# Patient Record
Sex: Female | Born: 2005 | Hispanic: No | Marital: Single | Smoking: Never smoker
Health system: Southern US, Community
[De-identification: ages and names within clinical notes are randomized; demographics above are authoritative.]

## PROBLEM LIST (undated history)

## (undated) DIAGNOSIS — G43109 Migraine with aura, not intractable, without status migrainosus: Secondary | ICD-10-CM

## (undated) DIAGNOSIS — H9319 Tinnitus, unspecified ear: Secondary | ICD-10-CM

## (undated) HISTORY — DX: Tinnitus, unspecified ear: H93.19

## (undated) HISTORY — DX: Migraine with aura, not intractable, without status migrainosus: G43.109

---
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
## 2023-02-10 ENCOUNTER — Ambulatory Visit (INDEPENDENT_AMBULATORY_CARE_PROVIDER_SITE_OTHER): Payer: BC Managed Care – PPO | Admitting: Adult Health

## 2023-02-10 ENCOUNTER — Encounter: Payer: Self-pay | Admitting: Adult Health

## 2023-02-10 VITALS — BP 126/68 | HR 71 | Temp 97.7°F

## 2023-02-10 DIAGNOSIS — S060X0A Concussion without loss of consciousness, initial encounter: Secondary | ICD-10-CM | POA: Diagnosis not present

## 2023-02-10 NOTE — Progress Notes (Signed)
[facility] [address] [place], [place] [address] Phone: [phone] Fax: [phone]   Office Visit Note  Patient Name: Caitlin Schwartz  Date of Birth [birth year]  Med Record Number [id number]  Date of Service: 02/10/2023  Chief Complaint  Patient presents with   Concussion    Vital Signs: BP 126/68   Pulse 71   Temp 97.7 F (36.5 C) (Tympanic)   SpO2 99%   Patient has no known allergies.  Current Medication:  Outpatient Encounter Medications as of 02/10/2023  Medication Sig   etonogestrel (NEXPLANON) 68 MG IMPL implant 68 mg by subdermal route once.   propranolol (INDERAL) 40 MG tablet Take 40 mg by mouth 2 (two) times daily.   No facility-administered encounter medications on file as of 02/10/2023.    Medical History: Past Medical History:  Diagnosis Date   Migraine with aura    Tinnitus     Acute Concussion Evaluation (ACE)  Date/Time of Injury 02/08/23 1:30am Reported by Patient  Injury Description Patient reports she was running, and tripped on a curb.  She fell forward and hit the left side of her head on the sidewalk.  She bled, and now has been having dizziness, and some head pain.   Is there evidence of a forcible blow to the head (direct or indirect)? Yes Evidence of Intracranial injury or skill fracture? No Location of impact Frontal Causes Fall  Amnesia Before (Retrograde) Are there events just BEFORE the injury that you/person has no memory of (even brief)? No Amnesia After (Anterograde) Are there any events just AFTER the injury that you/person has no memory of (even brief)? Yes Loss of Consciousness No Early signs: N/A Seizures: No  Headache: 1  Nausea: 0  Vomiting: 0  Balance Difficulty: 0  Dizziness: 1  Visual Problems:0  Fatigue: 1  Sensitivity to light: 1  Sensitivity to noise: 1  Numbness/Tingling: 0    Daytime Drowsiness: 1  Sleep Less Than Usual: 1  Sleep More Than Usual: 0  Trouble Falling Asleep: 1     Irritability: 0  Sadness: 0  Feeling More Emotional: 1  Nervousness: 1    Feeling Mentally Foggy: 1  Feeling Slowed Down: 0  Difficulty Concentrating: 1  Difficulty Remembering: 0   Total Symptom Score: 12  Does Exertion/Physical Activity makes symptoms worse? Yes Does Cognitive Activity (reading, writing, studying) make symptoms worse?YES Overall Rating: How different is the person acting compared to his/her usual self? 3  Risk Factors for Protracted Recovery  History of Concussion? No How many Previous Concussions? 0 Longest symptom duration N/A If multiple concussions, was less force needed to cause reinjury? No History of Headaches?Yes Prior treatment for headaches?Yes Personal History of Migraines?Yes Family History of MigrainesYes  Mom has Migraines  History of Learning Disabilities?No History of ADD or ADHD?No Other Developmental disorders? No   History of Anxiety?No History of Depression?No Sleep Disorder?No Other Psychiatric DisorderNo    Physical EXAM Physical Exam Vitals and nursing note reviewed.  Constitutional:      Appearance: Normal appearance.  HENT:     Head:     Comments: Had small abrasion to left forehead, covered with bandaid, mild bruising to left upper eye lid.    Right Ear: Tympanic membrane and ear canal normal.     Left Ear: Tympanic membrane and ear canal normal.     Nose: Nose normal.     Mouth/Throat:     Mouth: Mucous membranes are moist.  Eyes:  Pupils: Pupils are equal, round, and reactive to light.  Pulmonary:     Effort: Pulmonary effort is normal.     Breath sounds: Normal breath sounds.  Neurological:     General: No focal deficit present.     Mental Status: She is alert and oriented to person, place, and time.     Cranial Nerves: No cranial nerve deficit.     Motor: No weakness.     Coordination: Coordination normal.     Gait: Gait normal.    Assessment/Plan: 1. Concussion without loss of consciousness, initial  encounter Ace of 12 Findings consistent with Concussion/mild TBI.  Unfit for class and will need extensions on assignments/tests.  Discussed with patient the importance of physical and mental rest for recovery.  Reviewed Head Injury protocol, including limiting screen time, frequent rest breaks, cognitive rest. Avoid physical activity, bright light, loud noise and alcohol consumption. May take tylenol for headache. Encouraged patient to rest, take naps as needed and drink plenty of water.   Refer to the emergency department with sudden onset of any of the following; Headaches that worsen, seizures, looks very drowsy/can't be awakened, repeated vomiting, slurred speech, can't recognize people or places, increasing confusion or irritability, weakness or numbness in arms/legs, neck pain, unusual behavior change, or change in state of consciousness.      Follow up in office in 2 days.   General Counseling: Kyndal verbalizes understanding of the findings of todays visit and agrees with plan of treatment. I have discussed any further diagnostic evaluation that may be needed or ordered today. We also reviewed her medications today. she has been encouraged to call the office with any questions or concerns that should arise related to todays visit.   No orders of the defined types were placed in this encounter.   No orders of the defined types were placed in this encounter.   Time spent:25 Minutes Time spent includes review of chart, medications, test results, and follow up plan with the patient.    Johnna Acosta AGNP-C Nurse Practitioner

## 2023-02-12 ENCOUNTER — Ambulatory Visit (INDEPENDENT_AMBULATORY_CARE_PROVIDER_SITE_OTHER): Payer: BC Managed Care – PPO | Admitting: Oncology

## 2023-02-12 ENCOUNTER — Ambulatory Visit: Payer: BC Managed Care – PPO | Admitting: Oncology

## 2023-02-12 ENCOUNTER — Other Ambulatory Visit: Payer: Self-pay

## 2023-02-12 ENCOUNTER — Encounter: Payer: Self-pay | Admitting: Oncology

## 2023-02-12 VITALS — BP 101/81 | HR 53 | Temp 98.0°F | Wt 136.0 lb

## 2023-02-12 DIAGNOSIS — S060X0D Concussion without loss of consciousness, subsequent encounter: Secondary | ICD-10-CM

## 2023-02-12 NOTE — Progress Notes (Signed)
[facility] [address] [place], [place] [address] Phone: [phone] Fax: [phone]   Office Visit Note  Patient Name: Caitlin Schwartz  Date of Birth [birth year]  Med Record Number [id number]  Date of Service: 02/12/2023  Chief Complaint  Patient presents with   Follow-up    Vital Signs: BP 101/81   Pulse (!) 53   Temp 98 F (36.7 C) (Tympanic)   Wt 136 lb (61.7 kg)   SpO2 98%   BMI 22.99 kg/m   Patient has no known allergies.  Current Medication:  Outpatient Encounter Medications as of 02/12/2023  Medication Sig   etonogestrel (NEXPLANON) 68 MG IMPL implant 68 mg by subdermal route once.   propranolol (INDERAL) 40 MG tablet Take 40 mg by mouth 2 (two) times daily.   No facility-administered encounter medications on file as of 02/12/2023.    Medical History: Past Medical History:  Diagnosis Date   Migraine with aura    Tinnitus     Acute Concussion Evaluation (ACE)  Date/Time of Injury 02/08/23 1:30am Reported by Patient  Injury Description Patient reports she was running, and tripped on a curb.  She fell forward and hit the left side of her head on the sidewalk.  She bled, and now has been having dizziness, and some head pain.   Feeling better. Sensitive to noise more then light. No Nausea. Has a sore feeling in the front of her head   Intermittently. Was able to go to class this morning. Has not needed to take any Tylenol or ibuprofen  Is there evidence of a forcible blow to the head (direct or indirect)? Yes Evidence of Intracranial injury or skill fracture? No Location of impact Frontal Causes Fall  Amnesia Before (Retrograde) Are there events just BEFORE the injury that you/person has no memory of (even brief)? No Amnesia After (Anterograde) Are there any events just AFTER the injury that you/person has no memory of (even brief)? Yes Loss of Consciousness No Early signs: N/A Seizures: No  Headache: 1  Nausea: 0  Vomiting: 0  Balance  Difficulty: 0  Dizziness: 0  Visual Problems:0  Fatigue: 1  Sensitivity to light: 0  Sensitivity to noise: 1  Numbness/Tingling: 0    Daytime Drowsiness: 0  Sleep Less Than Usual: 1  Sleep More Than Usual: 0  Trouble Falling Asleep: 1    Irritability: 1  Sadness: 0  Feeling More Emotional: 0  Nervousness: 0    Feeling Mentally Foggy: 0  Feeling Slowed Down: 0  Difficulty Concentrating: 1  Difficulty Remembering: 0   Total Symptom Score: 7 (12)  Does Exertion/Physical Activity makes symptoms worse? Yes Does Cognitive Activity (reading, writing, studying) make symptoms worse?YES Overall Rating: How different is the person acting compared to his/her usual self? 1  Risk Factors for Protracted Recovery  History of Concussion? No How many Previous Concussions? 0 Longest symptom duration N/A If multiple concussions, was less force needed to cause reinjury? No History of Headaches?Yes Prior treatment for headaches?Yes Personal History of Migraines?Yes Family History of MigrainesYes  Mom has Migraines  History of Learning Disabilities?No History of ADD or ADHD?No Other Developmental disorders? No   History of Anxiety?No History of Depression?No Sleep Disorder?No Other Psychiatric DisorderNo    Physical EXAM Physical Exam Vitals and nursing note reviewed.  Constitutional:      Appearance: Normal appearance.  HENT:     Head:     Comments: Small abrasion to left forehead that appears to be healing nicely.  Right Ear: Tympanic membrane and ear canal normal.     Left Ear: Tympanic membrane and ear canal normal.     Nose: Nose normal.     Mouth/Throat:     Mouth: Mucous membranes are moist.  Eyes:     Pupils: Pupils are equal, round, and reactive to light.  Pulmonary:     Effort: Pulmonary effort is normal.     Breath sounds: Normal breath sounds.  Neurological:     General: No focal deficit present.     Mental Status: She is alert and oriented to person,  place, and time.     Cranial Nerves: No cranial nerve deficit.     Motor: No weakness.     Coordination: Coordination normal.     Gait: Gait normal.    Assessment/Plan: 1. Concussion without loss of consciousness, subsequent encounter -Ace of 7 (12). -Discussed continue cognitive rest as needed.  She feels much improved from previous visit and has been going to class.  Continue using glasses while outside and avoid noisy spaces. -She has uploaded her student care and out reach form should she need additional days off from class or extensions on assignments.  -Discussed with patient the importance of physical and mental rest for recovery.   -Reviewed Head Injury protocol, including limiting screen time, frequent rest breaks, cognitive rest. Avoid physical activity, bright light, loud noise and alcohol consumption. -May take tylenol for headache. Encouraged patient to rest, take naps as needed and drink plenty of water.   Refer to the emergency department with sudden onset of any of the following; Headaches that worsen, seizures, looks very drowsy/can't be awakened, repeated vomiting, slurred speech, can't recognize people or places, increasing confusion or irritability, weakness or numbness in arms/legs, neck pain, unusual behavior change, or change in state of consciousness.     Disposition-given improvement of her status, do not feel she needs any additional follow-up at this time.  I have asked her to reach back out should symptoms worsen.  General Counseling: Tene verbalizes understanding of the findings of todays visit and agrees with plan of treatment. I have discussed any further diagnostic evaluation that may be needed or ordered today. We also reviewed her medications today. she has been encouraged to call the office with any questions or concerns that should arise related to todays visit.   No orders of the defined types were placed in this encounter.   No orders of the defined  types were placed in this encounter.   Time spent:I spent 20 minutes dedicated to the care of this patient (face-to-face and non-face-to-face) on the date of the encounter to include what is described in the assessment and plan.  Durenda Hurt, NP 02/12/2023 2:45 PM

## 2023-02-20 ENCOUNTER — Ambulatory Visit (INDEPENDENT_AMBULATORY_CARE_PROVIDER_SITE_OTHER): Payer: BC Managed Care – PPO | Admitting: Oncology

## 2023-02-20 ENCOUNTER — Encounter: Payer: Self-pay | Admitting: Oncology

## 2023-02-20 VITALS — HR 93 | Temp 98.7°F

## 2023-02-20 DIAGNOSIS — N898 Other specified noninflammatory disorders of vagina: Secondary | ICD-10-CM

## 2023-02-20 DIAGNOSIS — R300 Dysuria: Secondary | ICD-10-CM | POA: Diagnosis not present

## 2023-02-20 LAB — POCT URINALYSIS DIPSTICK (MANUAL)
Leukocytes, UA: NEGATIVE
Nitrite, UA: NEGATIVE
Poct Bilirubin: NEGATIVE
Poct Blood: NEGATIVE
Poct Glucose: NORMAL mg/dL
Poct Ketones: NEGATIVE
Poct Protein: NEGATIVE mg/dL
Poct Urobilinogen: NORMAL mg/dL
Spec Grav, UA: 1.02 (ref 1.010–1.025)
pH, UA: 6.5 (ref 5.0–8.0)

## 2023-02-20 LAB — POCT WET PREP (WET MOUNT)
Clue Cells Wet Prep Whiff POC: POSITIVE
Trichomonas Wet Prep HPF POC: ABSENT

## 2023-02-20 MED ORDER — FLUCONAZOLE 150 MG PO TABS: 150.0000 mg | ORAL_TABLET | Freq: Once | ORAL | 0 refills | Status: AC

## 2023-02-20 NOTE — Progress Notes (Signed)
[facility] [address] [place], [place] [address] Phone: [phone] Fax: [phone]   Office Visit Note  Patient Name: Caitlin Schwartz  Date of [id number]  Med Rec number [id number]  Date of Service: 02/20/2023  Patient has no known allergies.  Chief Complaint  Patient presents with   STD   Urinary Tract Infection   Vaginitis   HPI Patient is an 17 y.o. student here STI testing. Also having some vaginal itching that started on Tuesday/Wednesday. Noticed a little bit of vaginal discharge that that was white/brown in color without significant odor. No pain with urination, increased urinary frequency, urgency or hematuria.  No nocturia.  Would like to be tested for a UTI as well as a yeast infection.  States she has had a yeast infection in the past and symptoms feel similar but she is wanting to rule out others things as well.  No pelvic or abdominal pain.  No low back pain.  No fevers.  Sexually active and uses protection most of the time.  Current Medication:  Outpatient Encounter Medications as of 02/20/2023  Medication Sig   etonogestrel (NEXPLANON) 68 MG IMPL implant 68 mg by subdermal route once.   propranolol (INDERAL) 40 MG tablet Take 40 mg by mouth 2 (two) times daily.   No facility-administered encounter medications on file as of 02/20/2023.      Medical History: Past Medical History:  Diagnosis Date   Migraine with aura    Tinnitus      Vital Signs: Pulse 93   Temp 98.7 F (37.1 C) (Tympanic)   SpO2 98%   ROS: As per HPI.  All other pertinent ROS negative.     Review of Systems  Genitourinary:  Positive for menstrual problem (Vaginal itching) and vaginal discharge. Negative for decreased urine volume, dysuria, flank pain, frequency, hematuria, urgency and vaginal bleeding.    Physical Exam Constitutional:      Appearance: Normal appearance.  Genitourinary:    Vagina: Vaginal discharge present.     Cervix: No erythema.  Neurological:      Mental Status: She is alert.     Results for orders placed or performed in visit on 02/20/23 (from the past 24 hour(s))  POCT Urinalysis Dip Manual     Status: Normal   Collection Time: 02/20/23  1:07 PM  Result Value Ref Range   Spec Grav, UA 1.020 1.010 - 1.025   pH, UA 6.5 5.0 - 8.0   Leukocytes, UA Negative Negative   Nitrite, UA Negative Negative   Poct Protein Negative Negative, trace mg/dL   Poct Glucose Normal Normal mg/dL   Poct Ketones Negative Negative   Poct Urobilinogen Normal Normal mg/dL   Poct Bilirubin Negative Negative   Poct Blood Negative Negative, trace    Assessment/Plan: 1. Vaginal discharge -Wet prep and exam consistent with yeast infection and also bacterial vaginosis.  pH 5.  Whiff test positive.  Discussed treating for yeast infection given itching seems to be her most concerning symptoms and will hold off on treating for bacterial vaginosis at this time.  Student is in agreement.  She will let me know if her symptoms worsen or fail to improve after Diflucan. -Discussed if interested she could try over-the-counter boric acid suppositories to help with vaginal pH. -Urinalysis inconsistent with UTI. -Will send results of STI testing via MyChart when available.  - POCT Urinalysis Dip Manual - fluconazole (DIFLUCAN) 150 MG tablet; Take 1 tablet (150 mg total) by mouth once for  1 dose.  Dispense: 1 tablet; Refill: 0 - POCT Wet Prep [facility]) - Chlamydia/Gonococcus/Trichomonas, NAA   General Counseling: Freyja verbalizes understanding of the findings of todays visit and agrees with plan of treatment. I have discussed any further diagnostic evaluation that may be needed or ordered today. We also reviewed her medications today. she has been encouraged to call the office with any questions or concerns that should arise related to todays visit.   Orders Placed This Encounter  Procedures   POCT Urinalysis Dip Manual    No orders of the defined types  were placed in this encounter.   I spent 20 minutes dedicated to the care of this patient (face-to-face and non-face-to-face) on the date of the encounter to include what is described in the assessment and plan.   Durenda Hurt, NP 02/20/2023 1:18 PM

## 2023-02-22 LAB — CHLAMYDIA/GONOCOCCUS/TRICHOMONAS, NAA
Chlamydia by NAA: NEGATIVE
Gonococcus by NAA: NEGATIVE
Trich vag by NAA: NEGATIVE

## 2023-02-23 ENCOUNTER — Encounter: Payer: Self-pay | Admitting: Oncology
